# Patient Record
Sex: Male | Born: 1990 | Race: Black or African American | Hispanic: No | Marital: Single | Smoking: Never smoker
Health system: Southern US, Community
[De-identification: ages and names within clinical notes are randomized; demographics above are authoritative.]

---
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
## 2011-12-31 ENCOUNTER — Encounter (HOSPITAL_COMMUNITY): Payer: Self-pay

## 2011-12-31 ENCOUNTER — Emergency Department (INDEPENDENT_AMBULATORY_CARE_PROVIDER_SITE_OTHER)
Admission: EM | Admit: 2011-12-31 | Discharge: 2011-12-31 | Disposition: A | Discharge status: Home / Self Care | Payer: BC Managed Care – PPO | Source: Home / Self Care

## 2011-12-31 DIAGNOSIS — S8010XA Contusion of unspecified lower leg, initial encounter: Secondary | ICD-10-CM

## 2011-12-31 DIAGNOSIS — S8011XA Contusion of right lower leg, initial encounter: Secondary | ICD-10-CM

## 2011-12-31 MED ORDER — ACETAMINOPHEN 500 MG PO TABS: 1000.0000 mg | ORAL_TABLET | Freq: Four times a day (QID) | ORAL | Status: AC | PRN

## 2011-12-31 MED ORDER — IBUPROFEN 800 MG PO TABS
ORAL_TABLET | ORAL | Status: AC
  Filled 2011-12-31: qty 1

## 2011-12-31 MED ORDER — IBUPROFEN 600 MG PO TABS: 600.0000 mg | ORAL_TABLET | Freq: Four times a day (QID) | ORAL | Status: AC | PRN

## 2011-12-31 MED ORDER — LORAZEPAM 2 MG/ML IJ SOLN
INTRAMUSCULAR | Status: AC
  Filled 2011-12-31: qty 1
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
## 2012-11-07 ENCOUNTER — Encounter (HOSPITAL_COMMUNITY): Payer: Self-pay

## 2012-11-07 ENCOUNTER — Emergency Department (INDEPENDENT_AMBULATORY_CARE_PROVIDER_SITE_OTHER)
Admission: EM | Admit: 2012-11-07 | Discharge: 2012-11-07 | Disposition: A | Discharge status: Home / Self Care | Payer: BC Managed Care – PPO | Source: Home / Self Care | Attending: Family Medicine | Admitting: Family Medicine

## 2012-11-07 DIAGNOSIS — K5289 Other specified noninfective gastroenteritis and colitis: Secondary | ICD-10-CM

## 2012-11-07 DIAGNOSIS — K529 Noninfective gastroenteritis and colitis, unspecified: Secondary | ICD-10-CM

## 2012-11-07 MED ORDER — ONDANSETRON 4 MG PO TBDP
ORAL_TABLET | ORAL | Status: AC
  Filled 2012-11-07: qty 1

## 2012-11-07 MED ORDER — ONDANSETRON 4 MG PO TBDP
4.0000 mg | ORAL_TABLET | Freq: Once | ORAL | Status: AC
  Administered 2012-11-07: 4 mg via ORAL

## 2012-11-07 MED ORDER — ONDANSETRON HCL 4 MG PO TABS: 4.0000 mg | ORAL_TABLET | Freq: Four times a day (QID) | ORAL | Status: DC

## 2012-11-07 NOTE — ED Notes (Signed)
I think I have a stomach problem; n/v/d/, pain since ~3-4 AM today; NAD at present

## 2012-11-07 NOTE — ED Provider Notes (Signed)
History     CSN: [id number]  Arrival date & time 11/07/12  1350   First MD Initiated Contact with Patient 11/07/12 1459      Chief Complaint  Patient presents with  . GI Problem    (Consider location/radiation/quality/duration/timing/severity/associated sxs/prior treatment) Patient is a 22 y.o. male presenting with GI illness. The history is provided by the patient.  GI Problem This is a new problem. The current episode started 12 to 24 hours ago (onset at work last eve, no diarrhea., father with similar sx.). The problem has been gradually improving. Pertinent negatives include no abdominal pain.    History reviewed. No pertinent past medical history.  History reviewed. No pertinent past surgical history.  History reviewed. No pertinent family history.  History  Substance Use Topics  . Smoking status: Current Every Day Smoker  . Smokeless tobacco: Not on file  . Alcohol Use: No      Review of Systems  Constitutional: Negative.   Gastrointestinal: Positive for nausea and vomiting. Negative for abdominal pain, diarrhea and constipation.    Allergies  Review of patient's allergies indicates no known allergies.  Home Medications   Current Outpatient Rx  Name  Route  Sig  Dispense  Refill  . ondansetron (ZOFRAN) 4 MG tablet   Oral   Take 1 tablet (4 mg total) by mouth every 6 (six) hours. Prn n/v   6 tablet   0     BP 130/82  Pulse 64  Temp(Src) 98.5 F (36.9 C) (Oral)  Resp 18  SpO2 98%  Physical Exam  Nursing note and vitals reviewed. Constitutional: He is oriented to person, place, and time. He appears well-developed and well-nourished.  Abdominal: Soft. Bowel sounds are normal. He exhibits no distension and no mass. There is no tenderness. There is no rebound and no guarding.  Neurological: He is alert and oriented to person, place, and time.  Skin: Skin is warm and dry.    ED Course  Procedures (including critical care time)  Labs Reviewed -  No data to display No results found.   1. Gastroenteritis, acute       MDM          Linna Hoff, MD 11/07/12 [phone]

## 2013-12-03 ENCOUNTER — Emergency Department (HOSPITAL_COMMUNITY): Payer: BC Managed Care – PPO

## 2013-12-03 ENCOUNTER — Emergency Department (HOSPITAL_COMMUNITY)
Admission: EM | Admit: 2013-12-03 | Discharge: 2013-12-03 | Disposition: A | Discharge status: Home / Self Care | Payer: BC Managed Care – PPO | Attending: Emergency Medicine | Admitting: Emergency Medicine

## 2013-12-03 ENCOUNTER — Encounter (HOSPITAL_COMMUNITY): Payer: Self-pay | Admitting: Emergency Medicine

## 2013-12-03 DIAGNOSIS — Y9389 Activity, other specified: Secondary | ICD-10-CM | POA: Insufficient documentation

## 2013-12-03 DIAGNOSIS — M549 Dorsalgia, unspecified: Secondary | ICD-10-CM

## 2013-12-03 DIAGNOSIS — Y92410 Unspecified street and highway as the place of occurrence of the external cause: Secondary | ICD-10-CM | POA: Insufficient documentation

## 2013-12-03 DIAGNOSIS — IMO0002 Reserved for concepts with insufficient information to code with codable children: Secondary | ICD-10-CM | POA: Insufficient documentation

## 2013-12-03 MED ORDER — OXYCODONE-ACETAMINOPHEN 5-325 MG PO TABS
1.0000 | ORAL_TABLET | Freq: Once | ORAL | Status: AC
  Administered 2013-12-03: 1 via ORAL
  Filled 2013-12-03: qty 1

## 2013-12-03 MED ORDER — CYCLOBENZAPRINE HCL 10 MG PO TABS: 10.0000 mg | ORAL_TABLET | Freq: Two times a day (BID) | ORAL | Status: DC | PRN
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
## 2013-12-03 NOTE — ED Notes (Signed)
Pt denies neck pain. Reports shoulder and upper back pain only "when I apply pressure" ( moves arms forwards forcefully to demonstrate pressure. C collar and LSB in place.
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
## 2013-12-04 ENCOUNTER — Encounter (HOSPITAL_COMMUNITY): Payer: Self-pay

## 2013-12-08 NOTE — ED Provider Notes (Signed)
Medical screening examination/treatment/procedure(s) were performed by non-physician practitioner and as supervising physician I was immediately available for consultation/collaboration.  Afsheen Antony T Abbiegail Landgren, MD 12/08/13 0734 

## 2017-11-01 ENCOUNTER — Encounter (HOSPITAL_COMMUNITY): Payer: Self-pay | Admitting: Emergency Medicine

## 2017-11-01 ENCOUNTER — Ambulatory Visit (HOSPITAL_COMMUNITY)
Admission: EM | Admit: 2017-11-01 | Discharge: 2017-11-01 | Disposition: A | Discharge status: Home / Self Care | Payer: BC Managed Care – PPO | Attending: Internal Medicine | Admitting: Internal Medicine

## 2017-11-01 DIAGNOSIS — G8929 Other chronic pain: Secondary | ICD-10-CM

## 2017-11-01 DIAGNOSIS — M546 Pain in thoracic spine: Secondary | ICD-10-CM

## 2017-11-01 MED ORDER — MELOXICAM 7.5 MG PO TABS: 7.5000 mg | ORAL_TABLET | Freq: Every day | ORAL | 0 refills | Status: DC

## 2017-11-01 MED ORDER — CYCLOBENZAPRINE HCL 5 MG PO TABS: 5.0000 mg | ORAL_TABLET | Freq: Every evening | ORAL | 0 refills | Status: DC | PRN
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
## 2017-11-01 NOTE — Discharge Instructions (Signed)
Start Mobic as directed. Flexeril as needed at night. Flexeril can make you drowsy, so do not take if you are going to drive, operate heavy machinery, or make important decisions. Ice/heat compresses as needed. This can take up to 3-4 weeks to completely resolve, but you should be feeling better each week. Please establish PCP care for further evaluation and treatment of chronic back pain.
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
## 2019-09-08 ENCOUNTER — Ambulatory Visit (INDEPENDENT_AMBULATORY_CARE_PROVIDER_SITE_OTHER): Payer: Self-pay

## 2019-09-08 ENCOUNTER — Other Ambulatory Visit: Payer: Self-pay

## 2019-09-08 ENCOUNTER — Ambulatory Visit (HOSPITAL_COMMUNITY)
Admission: EM | Admit: 2019-09-08 | Discharge: 2019-09-08 | Disposition: A | Discharge status: Home / Self Care | Payer: Self-pay | Attending: Family Medicine | Admitting: Family Medicine

## 2019-09-08 DIAGNOSIS — M546 Pain in thoracic spine: Secondary | ICD-10-CM

## 2019-09-08 MED ORDER — NAPROXEN 500 MG PO TABS: 500.0000 mg | ORAL_TABLET | Freq: Two times a day (BID) | ORAL | 0 refills | Status: DC

## 2019-09-08 MED ORDER — CYCLOBENZAPRINE HCL 10 MG PO TABS: 10.0000 mg | ORAL_TABLET | Freq: Two times a day (BID) | ORAL | 0 refills | Status: DC | PRN
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
## 2020-07-31 ENCOUNTER — Other Ambulatory Visit: Payer: Self-pay

## 2021-01-11 ENCOUNTER — Other Ambulatory Visit: Payer: Self-pay

## 2021-01-11 ENCOUNTER — Ambulatory Visit (HOSPITAL_COMMUNITY)
Admission: EM | Admit: 2021-01-11 | Discharge: 2021-01-11 | Disposition: A | Discharge status: Home / Self Care | Payer: Self-pay | Attending: Student | Admitting: Student

## 2021-01-11 ENCOUNTER — Encounter (HOSPITAL_COMMUNITY): Payer: Self-pay | Admitting: Emergency Medicine

## 2021-01-11 DIAGNOSIS — R197 Diarrhea, unspecified: Secondary | ICD-10-CM

## 2021-01-11 DIAGNOSIS — Z0289 Encounter for other administrative examinations: Secondary | ICD-10-CM

## 2021-01-11 DIAGNOSIS — A084 Viral intestinal infection, unspecified: Secondary | ICD-10-CM

## 2021-01-11 DIAGNOSIS — R112 Nausea with vomiting, unspecified: Secondary | ICD-10-CM

## 2021-01-11 MED ORDER — LOPERAMIDE HCL 2 MG PO CAPS: 2.0000 mg | ORAL_CAPSULE | Freq: Four times a day (QID) | ORAL | 0 refills | Status: AC | PRN

## 2021-01-11 MED ORDER — ONDANSETRON 8 MG PO TBDP: 8.0000 mg | ORAL_TABLET | Freq: Three times a day (TID) | ORAL | 0 refills | Status: AC | PRN

## 2021-01-11 NOTE — ED Triage Notes (Addendum)
On Sunday started feeling bad.  That night had a terrible headache and had nausea.  Yesterday and today has had headache, nausea, vomiting.  Patient called in to work.  Has not taken any medicines for symptoms,  patient has been drinking broths, other liquids.   29 year old daughter is having the same symptoms  Requesting a doctors note

## 2021-01-11 NOTE — ED Provider Notes (Signed)
[facility]    CSN: [id number] Arrival date & time: 01/11/21  1400      History   Chief Complaint Chief Complaint  Patient presents with   Emesis    HPI Justin Cervantes is a 30 y.o. male presenting with nausea and vomiting.  States he requires a work note since he missed work today.  Medical history noncontributory.  States he is has been feeling bad for 2 days.  Symptoms started with a headache that progressed into nausea.  Today he has nausea with vomiting. 3 episodes of vomiting, same of diarrhea. Tolerating liquid and broth, but decreased appetite for solid food.  Notes that his 56-year-old daughter has the same symptoms. They did eat at food trucks recently, but symptoms started 2 days after this.  Denies URI symptoms including cough, congestion.  Denies hematemesis, hematochezia, BRBPR.  HPI  History reviewed. No pertinent past medical history.  There are no problems to display for this patient.   History reviewed. No pertinent surgical history.     Home Medications    Prior to Admission medications   Medication Sig Start Date End Date Taking? Authorizing Provider  loperamide (IMODIUM) 2 MG capsule Take 1 capsule (2 mg total) by mouth 4 (four) times daily as needed for diarrhea or loose stools. 01/11/21  Yes Rhys Martini, PA-C  ondansetron (ZOFRAN ODT) 8 MG disintegrating tablet Take 1 tablet (8 mg total) by mouth every 8 (eight) hours as needed for nausea or vomiting. 01/11/21  Yes Rhys Martini, PA-C    Family History Family History  Problem Relation Age of Onset   Hypertension Mother    Diabetes Father     Social History Social History   Tobacco Use   Smoking status: Never   Smokeless tobacco: Never  Vaping Use   Vaping Use: Never used  Substance Use Topics   Alcohol use: Yes    Comment: occassional   Drug use: Yes    Types: Marijuana     Allergies   Patient has no known allergies.   Review of Systems Review of Systems   Constitutional:  Negative for appetite change, chills, diaphoresis, fever and unexpected weight change.  HENT:  Negative for congestion, ear pain, sinus pressure, sinus pain, sneezing, sore throat and trouble swallowing.   Respiratory:  Negative for cough, chest tightness and shortness of breath.   Cardiovascular:  Negative for chest pain.  Gastrointestinal:  Positive for diarrhea, nausea and vomiting. Negative for abdominal distention, abdominal pain, anal bleeding, blood in stool, constipation and rectal pain.  Genitourinary:  Negative for dysuria, flank pain, frequency and urgency.  Musculoskeletal:  Negative for back pain and myalgias.  Neurological:  Negative for dizziness, light-headedness and headaches.  All other systems reviewed and are negative.   Physical Exam Triage Vital Signs ED Triage Vitals [01/11/21 1512]  Enc Vitals Group     BP (!) 158/98     Pulse Rate 66     Resp 16     Temp 99 F (37.2 C)     Temp Source Oral     SpO2 100 %     Weight      Height      Head Circumference      Peak Flow      Pain Score      Pain Loc      Pain Edu?      Excl. in GC?    No data found.  Updated Vital Signs BP Marland Kitchen)  158/98 (BP Location: Right Arm)   Pulse 66   Temp 99 F (37.2 C) (Oral)   Resp 16   SpO2 100%   Visual Acuity Right Eye Distance:   Left Eye Distance:   Bilateral Distance:    Right Eye Near:   Left Eye Near:    Bilateral Near:     Physical Exam Vitals reviewed.  Constitutional:      General: He is not in acute distress.    Appearance: Normal appearance. He is not ill-appearing.  HENT:     Head: Normocephalic and atraumatic.     Mouth/Throat:     Mouth: Mucous membranes are moist.     Comments: Moist mucous membranes Eyes:     Extraocular Movements: Extraocular movements intact.     Pupils: Pupils are equal, round, and reactive to light.  Cardiovascular:     Rate and Rhythm: Normal rate and regular rhythm.     Heart sounds: Normal heart  sounds.  Pulmonary:     Effort: Pulmonary effort is normal.     Breath sounds: Normal breath sounds. No wheezing, rhonchi or rales.  Abdominal:     General: Abdomen is flat. Bowel sounds are increased. There is no distension.     Palpations: Abdomen is soft. There is no mass.     Tenderness: There is generalized abdominal tenderness. There is no right CVA tenderness, left CVA tenderness, guarding or rebound. Negative signs include Murphy's sign, Rovsing's sign and McBurney's sign.  Skin:    General: Skin is warm.     Capillary Refill: Capillary refill takes less than 2 seconds.     Comments: Good skin turgor  Neurological:     General: No focal deficit present.     Mental Status: He is alert and oriented to person, place, and time.  Psychiatric:        Mood and Affect: Mood normal.        Behavior: Behavior normal.     UC Treatments / Results  Labs (all labs ordered are listed, but only abnormal results are displayed) Labs Reviewed - No data to display  EKG   Radiology No results found.  Procedures Procedures (including critical care time)  Medications Ordered in UC Medications - No data to display  Initial Impression / Assessment and Plan / UC Course  I have reviewed the triage vital signs and the nursing notes.  Pertinent labs & imaging results that were available during my care of the patient were reviewed by me and considered in my medical decision making (see chart for details).     This patient is a 30 year old male presenting with viral gastroenteritis.  Afebrile, nontachycardic, no focal abdominal pain.  Appears well-hydrated.  Zofran and Imodium sent, continue good hydration and BRAT diet.  ED return precautions provided.  Work note provided.  Final Clinical Impressions(s) / UC Diagnoses   Final diagnoses:  Viral gastroenteritis  Non-intractable vomiting with nausea, unspecified vomiting type  Diarrhea, unspecified type  Encounter to obtain excuse from  work     Discharge Instructions      -Take the Zofran (ondansetron) up to 3 times daily for nausea and vomiting. Dissolve one pill under your tongue or between your teeth and your cheek. -Take the Imodium (loperamide) up to 4 times daily for diarrhea. -Drink plenty of fluids and eat a bland diet as tolerated -Most stomach bugs are contagious for about 24 hours after you stop having symptoms.  This means you will likely be  contagious until 6/23.     ED Prescriptions     Medication Sig Dispense Auth. Provider   ondansetron (ZOFRAN ODT) 8 MG disintegrating tablet Take 1 tablet (8 mg total) by mouth every 8 (eight) hours as needed for nausea or vomiting. 20 tablet Rhys Martini, PA-C   loperamide (IMODIUM) 2 MG capsule Take 1 capsule (2 mg total) by mouth 4 (four) times daily as needed for diarrhea or loose stools. 12 capsule Rhys Martini, PA-C      PDMP not reviewed this encounter.   Rhys Martini, PA-C 01/11/21 [id number]

## 2021-01-11 NOTE — Discharge Instructions (Addendum)
-  Take the Zofran (ondansetron) up to 3 times daily for nausea and vomiting. Dissolve one pill under your tongue or between your teeth and your cheek. -Take the Imodium (loperamide) up to 4 times daily for diarrhea. -Drink plenty of fluids and eat a bland diet as tolerated -Most stomach bugs are contagious for about 24 hours after you stop having symptoms.  This means you will likely be contagious until 6/23.

## 2021-02-01 ENCOUNTER — Other Ambulatory Visit: Payer: Self-pay

## 2021-02-01 ENCOUNTER — Encounter (HOSPITAL_COMMUNITY): Payer: Self-pay | Admitting: Emergency Medicine

## 2021-02-01 ENCOUNTER — Ambulatory Visit (HOSPITAL_COMMUNITY)
Admission: EM | Admit: 2021-02-01 | Discharge: 2021-02-01 | Disposition: A | Discharge status: Home / Self Care | Payer: Self-pay | Attending: Family Medicine | Admitting: Family Medicine

## 2021-02-01 DIAGNOSIS — S80862A Insect bite (nonvenomous), left lower leg, initial encounter: Secondary | ICD-10-CM

## 2021-02-01 DIAGNOSIS — L0291 Cutaneous abscess, unspecified: Secondary | ICD-10-CM

## 2021-02-01 DIAGNOSIS — W57XXXA Bitten or stung by nonvenomous insect and other nonvenomous arthropods, initial encounter: Secondary | ICD-10-CM

## 2021-02-01 MED ORDER — HIBICLENS 4 % EX LIQD: Freq: Every day | CUTANEOUS | 0 refills | Status: AC | PRN

## 2021-02-01 MED ORDER — DOXYCYCLINE HYCLATE 100 MG PO TABS: 100.0000 mg | ORAL_TABLET | Freq: Two times a day (BID) | ORAL | 0 refills | Status: AC

## 2021-02-01 NOTE — ED Provider Notes (Signed)
[facility]    CSN: [id number] Arrival date & time: 02/01/21  1155      History   Chief Complaint Chief Complaint  Patient presents with   Insect Bite    HPI Justin Cervantes is a 30 y.o. male.   Presenting today with 3-day history of acutely worsening wound on the left lateral lower leg.  He states it started as an insect bite that was itchy, so he was scratching it and over the past few days it has become an open wound, now forming an abscess and draining thick yellow pus.  The area is very tender.  He has been treating with peroxide, which hazel, Neosporin and keeping it covered with no benefit.  Denies fever, chills, sweats, body aches.   History reviewed. No pertinent past medical history.  There are no problems to display for this patient.   History reviewed. No pertinent surgical history.     Home Medications    Prior to Admission medications   Medication Sig Start Date End Date Taking? Authorizing Provider  chlorhexidine (HIBICLENS) 4 % external liquid Apply topically daily as needed. 02/01/21  Yes Particia Nearing, PA-C  doxycycline (VIBRA-TABS) 100 MG tablet Take 1 tablet (100 mg total) by mouth 2 (two) times daily. 02/01/21  Yes Particia Nearing, PA-C  loperamide (IMODIUM) 2 MG capsule Take 1 capsule (2 mg total) by mouth 4 (four) times daily as needed for diarrhea or loose stools. 01/11/21   Rhys Martini, PA-C  ondansetron (ZOFRAN ODT) 8 MG disintegrating tablet Take 1 tablet (8 mg total) by mouth every 8 (eight) hours as needed for nausea or vomiting. 01/11/21   Rhys Martini, PA-C    Family History Family History  Problem Relation Age of Onset   Hypertension Mother    Diabetes Father     Social History Social History   Tobacco Use   Smoking status: Never   Smokeless tobacco: Never  Vaping Use   Vaping Use: Never used  Substance Use Topics   Alcohol use: Yes    Comment: occassional   Drug use: Yes    Types: Marijuana      Allergies   Patient has no known allergies.   Review of Systems Review of Systems Per HPI  Physical Exam Triage Vital Signs ED Triage Vitals  Enc Vitals Group     BP 02/01/21 1412 (!) 161/93     Pulse Rate 02/01/21 1412 71     Resp 02/01/21 1412 18     Temp 02/01/21 1412 98.7 F (37.1 C)     Temp Source 02/01/21 1412 Oral     SpO2 02/01/21 1412 98 %     Weight --      Height --      Head Circumference --      Peak Flow --      Pain Score 02/01/21 1414 0     Pain Loc --      Pain Edu? --      Excl. in GC? --    No data found.  Updated Vital Signs BP (!) 161/93   Pulse 71   Temp 98.7 F (37.1 C) (Oral)   Resp 18   SpO2 98%   Visual Acuity Right Eye Distance:   Left Eye Distance:   Bilateral Distance:    Right Eye Near:   Left Eye Near:    Bilateral Near:     Physical Exam Vitals and nursing note reviewed.  Constitutional:  Appearance: Normal appearance.  HENT:     Head: Atraumatic.  Eyes:     Extraocular Movements: Extraocular movements intact.     Conjunctiva/sclera: Conjunctivae normal.  Cardiovascular:     Rate and Rhythm: Normal rate and regular rhythm.  Pulmonary:     Effort: Pulmonary effort is normal.     Breath sounds: Normal breath sounds.  Musculoskeletal:        General: Normal range of motion.     Cervical back: Normal range of motion and neck supple.  Skin:    General: Skin is warm.     Comments: 1 to 1.5 cm open wound with fluctuant pockets, active thick drainage to left lateral lower leg with surrounding erythema and edema, tender to palpation  Neurological:     General: No focal deficit present.     Mental Status: He is oriented to person, place, and time.  Psychiatric:        Mood and Affect: Mood normal.        Thought Content: Thought content normal.        Judgment: Judgment normal.   UC Treatments / Results  Labs (all labs ordered are listed, but only abnormal results are displayed) Labs Reviewed - No data to  display  EKG   Radiology No results found.  Procedures Procedures (including critical care time)  Medications Ordered in UC Medications - No data to display  Initial Impression / Assessment and Plan / UC Course  I have reviewed the triage vital signs and the nursing notes.  Pertinent labs & imaging results that were available during my care of the patient were reviewed by me and considered in my medical decision making (see chart for details).     Infected insect bite, will send Hibiclens, doxycycline and discussed good home wound care including warm compresses and strict return precautions for recheck if not improving over the next few days.  Final Clinical Impressions(s) / UC Diagnoses   Final diagnoses:  Abscess  Insect bite of left lower leg, initial encounter   Discharge Instructions   None    ED Prescriptions     Medication Sig Dispense Auth. Provider   chlorhexidine (HIBICLENS) 4 % external liquid Apply topically daily as needed. 120 mL Particia Nearing, [place]   doxycycline (VIBRA-TABS) 100 MG tablet Take 1 tablet (100 mg total) by mouth 2 (two) times daily. 20 tablet Particia Nearing, [place]      PDMP not reviewed this encounter.   Particia Nearing, [place] 02/01/21 1433

## 2021-02-01 NOTE — ED Triage Notes (Addendum)
Pt is present today with a open wound on his left lower leg. Pt states that he noticed it three days ago. Pt states that he may have gotten bit by something and caused irritation when he scratched it.
